# Patient Record
Sex: Female | Born: 2005 | Race: White | Hispanic: No | Marital: Single | Smoking: Never smoker
Health system: Southern US, Community
[De-identification: ages and names within clinical notes are randomized; demographics above are authoritative.]

---
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
## 2024-01-12 DIAGNOSIS — Z0100 Encounter for examination of eyes and vision without abnormal findings: Secondary | ICD-10-CM | POA: Diagnosis not present

## 2024-01-12 DIAGNOSIS — H52223 Regular astigmatism, bilateral: Secondary | ICD-10-CM | POA: Diagnosis not present

## 2024-03-07 DIAGNOSIS — L700 Acne vulgaris: Secondary | ICD-10-CM | POA: Diagnosis not present

## 2024-06-14 DIAGNOSIS — L700 Acne vulgaris: Secondary | ICD-10-CM | POA: Diagnosis not present

## 2024-06-24 DIAGNOSIS — N6323 Unspecified lump in the left breast, lower outer quadrant: Secondary | ICD-10-CM | POA: Diagnosis not present

## 2024-07-25 DIAGNOSIS — Z68.41 Body mass index (BMI) pediatric, 5th percentile to less than 85th percentile for age: Secondary | ICD-10-CM | POA: Diagnosis not present

## 2024-07-25 DIAGNOSIS — Z713 Dietary counseling and surveillance: Secondary | ICD-10-CM | POA: Diagnosis not present

## 2024-07-25 DIAGNOSIS — Z1330 Encounter for screening examination for mental health and behavioral disorders, unspecified: Secondary | ICD-10-CM | POA: Diagnosis not present

## 2024-07-25 DIAGNOSIS — N6323 Unspecified lump in the left breast, lower outer quadrant: Secondary | ICD-10-CM | POA: Diagnosis not present

## 2024-07-25 DIAGNOSIS — Z7189 Other specified counseling: Secondary | ICD-10-CM | POA: Diagnosis not present

## 2024-07-25 DIAGNOSIS — Z0001 Encounter for general adult medical examination with abnormal findings: Secondary | ICD-10-CM | POA: Diagnosis not present

## 2024-07-25 DIAGNOSIS — F32A Depression, unspecified: Secondary | ICD-10-CM | POA: Diagnosis not present

## 2024-10-03 DIAGNOSIS — L700 Acne vulgaris: Secondary | ICD-10-CM | POA: Diagnosis not present
